# Patient Record
Sex: Male | Born: 2000 | Race: Black or African American | Hispanic: No | Marital: Single | Smoking: Never smoker
Health system: Southern US, Community
[De-identification: ages and names within clinical notes are randomized; demographics above are authoritative.]

## PROBLEM LIST (undated history)

## (undated) DIAGNOSIS — K429 Umbilical hernia without obstruction or gangrene: Secondary | ICD-10-CM

## (undated) DIAGNOSIS — B350 Tinea barbae and tinea capitis: Secondary | ICD-10-CM

## (undated) HISTORY — DX: Tinea barbae and tinea capitis: B35.0

## (undated) HISTORY — DX: Umbilical hernia without obstruction or gangrene: K42.9

---
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
## 2018-02-12 ENCOUNTER — Other Ambulatory Visit: Payer: Self-pay

## 2018-02-12 ENCOUNTER — Ambulatory Visit (INDEPENDENT_AMBULATORY_CARE_PROVIDER_SITE_OTHER): Payer: 59

## 2018-02-12 ENCOUNTER — Ambulatory Visit (HOSPITAL_COMMUNITY)
Admission: EM | Admit: 2018-02-12 | Discharge: 2018-02-12 | Disposition: A | Discharge status: Home / Self Care | Payer: 59 | Attending: Urgent Care | Admitting: Urgent Care

## 2018-02-12 ENCOUNTER — Encounter (HOSPITAL_COMMUNITY): Payer: Self-pay

## 2018-02-12 DIAGNOSIS — S60212A Contusion of left wrist, initial encounter: Secondary | ICD-10-CM

## 2018-02-12 DIAGNOSIS — M25532 Pain in left wrist: Secondary | ICD-10-CM | POA: Diagnosis not present

## 2018-02-12 DIAGNOSIS — S50812A Abrasion of left forearm, initial encounter: Secondary | ICD-10-CM

## 2018-02-12 MED ORDER — ACETAMINOPHEN 325 MG PO TABS
ORAL_TABLET | ORAL | Status: AC
  Filled 2018-02-12: qty 2

## 2018-02-12 MED ORDER — ACETAMINOPHEN 325 MG PO TABS
650.0000 mg | ORAL_TABLET | Freq: Once | ORAL | Status: AC
  Administered 2018-02-12: 650 mg via ORAL

## 2018-02-12 MED ORDER — CYCLOBENZAPRINE HCL 5 MG PO TABS: 5.0000 mg | ORAL_TABLET | Freq: Three times a day (TID) | ORAL | 0 refills | Status: AC | PRN

## 2018-02-12 MED ORDER — MELOXICAM 7.5 MG PO TABS: 7.5000 mg | ORAL_TABLET | Freq: Every day | ORAL | 0 refills | Status: AC

## 2018-02-12 NOTE — Discharge Instructions (Addendum)
If this Flexeril makes you sleepy, use at bedtime only. Hydrate well with at least 2 liters (64 ounces) of water daily.

## 2018-02-12 NOTE — ED Triage Notes (Signed)
Pt has left wrist pain. This happened tonight.
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
## 2018-02-14 ENCOUNTER — Encounter (HOSPITAL_COMMUNITY): Payer: Self-pay | Admitting: Urgent Care
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
